# Patient Record
Sex: Female | Born: 2000 | Race: Black or African American | Hispanic: No | Marital: Single | Smoking: Never smoker
Health system: Southern US, Community
[De-identification: ages and names within clinical notes are randomized; demographics above are authoritative.]

## PROBLEM LIST (undated history)

## (undated) ENCOUNTER — Emergency Department (HOSPITAL_COMMUNITY): Discharge status: Absent without leave

## (undated) DIAGNOSIS — J45909 Unspecified asthma, uncomplicated: Secondary | ICD-10-CM

## (undated) DIAGNOSIS — L309 Dermatitis, unspecified: Secondary | ICD-10-CM

## (undated) DIAGNOSIS — Z9109 Other allergy status, other than to drugs and biological substances: Secondary | ICD-10-CM

---
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
## 2022-09-08 ENCOUNTER — Other Ambulatory Visit: Payer: Self-pay | Admitting: Internal Medicine

## 2022-09-09 LAB — COMPLETE METABOLIC PANEL WITH GFR
AG Ratio: 1.7 (calc) (ref 1.0–2.5)
ALT: 8 U/L (ref 5–32)
AST: 11 U/L — ABNORMAL LOW (ref 12–32)
Albumin: 4.3 g/dL (ref 3.6–5.1)
Alkaline phosphatase (APISO): 44 U/L (ref 36–128)
BUN: 14 mg/dL (ref 7–20)
CO2: 23 mmol/L (ref 20–32)
Calcium: 9.1 mg/dL (ref 8.9–10.4)
Chloride: 107 mmol/L (ref 98–110)
Creat: 0.65 mg/dL (ref 0.50–0.96)
Globulin: 2.5 g/dL (calc) (ref 2.0–3.8)
Glucose, Bld: 83 mg/dL (ref 65–99)
Potassium: 4.1 mmol/L (ref 3.8–5.1)
Sodium: 139 mmol/L (ref 135–146)
Total Bilirubin: 0.5 mg/dL (ref 0.2–1.1)
Total Protein: 6.8 g/dL (ref 6.3–8.2)
eGFR: 130 mL/min/{1.73_m2} (ref >=60)

## 2022-09-09 LAB — CBC
HCT: 38.3 % (ref 35.0–45.0)
Hemoglobin: 12.5 g/dL (ref 11.7–15.5)
MCH: 30.8 pg (ref 27.0–33.0)
MCHC: 32.6 g/dL (ref 32.0–36.0)
MCV: 94.3 fL (ref 80.0–100.0)
MPV: 9.6 fL (ref 7.5–12.5)
Platelets: 263 10*3/uL (ref 140–400)
RBC: 4.06 10*6/uL (ref 3.80–5.10)
RDW: 11.6 % (ref 11.0–15.0)
WBC: 4 10*3/uL (ref 3.8–10.8)

## 2022-09-09 LAB — LIPID PANEL
Cholesterol: 135 mg/dL (ref <170)
HDL: 54 mg/dL (ref >45)
LDL Cholesterol (Calc): 68 mg/dL (calc) (ref <110)
Non-HDL Cholesterol (Calc): 81 mg/dL (calc) (ref <120)
Total CHOL/HDL Ratio: 2.5 (calc) (ref <5.0)
Triglycerides: 46 mg/dL (ref <90)

## 2022-09-09 LAB — VITAMIN D 25 HYDROXY (VIT D DEFICIENCY, FRACTURES): Vit D, 25-Hydroxy: 14 ng/mL — ABNORMAL LOW (ref 30–100)

## 2023-02-07 ENCOUNTER — Emergency Department (HOSPITAL_COMMUNITY)

## 2023-02-07 ENCOUNTER — Encounter (HOSPITAL_COMMUNITY): Payer: Self-pay

## 2023-02-07 ENCOUNTER — Ambulatory Visit (HOSPITAL_COMMUNITY): Admission: EM | Admit: 2023-02-07 | Discharge: 2023-02-07 | Disposition: A | Discharge status: Home / Self Care

## 2023-02-07 ENCOUNTER — Encounter (HOSPITAL_COMMUNITY): Payer: Self-pay | Admitting: *Deleted

## 2023-02-07 ENCOUNTER — Emergency Department (HOSPITAL_COMMUNITY)
Admission: EM | Admit: 2023-02-07 | Discharge: 2023-02-07 | Disposition: A | Discharge status: Home / Self Care | Attending: Emergency Medicine | Admitting: Emergency Medicine

## 2023-02-07 ENCOUNTER — Other Ambulatory Visit: Payer: Self-pay

## 2023-02-07 DIAGNOSIS — S060X0A Concussion without loss of consciousness, initial encounter: Secondary | ICD-10-CM | POA: Diagnosis not present

## 2023-02-07 DIAGNOSIS — Y9321 Activity, ice skating: Secondary | ICD-10-CM | POA: Insufficient documentation

## 2023-02-07 DIAGNOSIS — S0990XA Unspecified injury of head, initial encounter: Secondary | ICD-10-CM | POA: Diagnosis present

## 2023-02-07 DIAGNOSIS — R519 Headache, unspecified: Secondary | ICD-10-CM

## 2023-02-07 MED ORDER — OXYCODONE-ACETAMINOPHEN 5-325 MG PO TABS: 1.0000 | ORAL_TABLET | Freq: Four times a day (QID) | ORAL | 0 refills | Status: AC | PRN

## 2023-02-07 MED ORDER — OXYCODONE-ACETAMINOPHEN 5-325 MG PO TABS
1.0000 | ORAL_TABLET | Freq: Once | ORAL | Status: AC
  Administered 2023-02-07: 1 via ORAL
  Filled 2023-02-07: qty 1

## 2023-02-07 NOTE — Discharge Instructions (Signed)
You were seen in the emergency department today for a head injury.  Your CT scan was negative for any acute findings such as a brain bleed or fractures in the skull.  I suspect that you have suffered a concussion from your multiple head injuries while skiing yesterday.  I provided you with a handout with some information how to manage this at home.  A prescription for Percocet was sent to her pharmacy for severe pain but you should take Tylenol or ibuprofen as needed for mild pain.  If you feel the symptoms are worsening or new symptoms arise, return the emergency department.

## 2023-02-07 NOTE — ED Provider Notes (Signed)
[place] [facility] AT [facility] Provider Note   CSN: [id number] Arrival date & time: 02/07/23  1421     History Chief Complaint  Patient presents with   Headache    Kathy Collins is a 22 y.o. female.  Patient without significant past medical history presents to the emergency department concerns of a head injury.  Reports that she was skiing yesterday and had multiple falls within without a helmet in place.  Endorsing mild headache and significant scalp tenderness on the posterior aspect of her head.  Denies any vomiting but has had some nausea.  Denies any vision changes or mood changes.  Not currently on any blood thinners.   Headache      Home Medications Prior to Admission medications   Medication Sig Start Date End Date Taking? Authorizing Provider  Prenatal Vit-Fe Fumarate-FA (PRENATAL COMPLETE) 14-0.4 MG TABS Take 1 tablet by mouth daily. 01/03/21   Raspet, Noberto Retort, PA-C      Allergies    Patient has no known allergies.    Review of Systems   Review of Systems  Neurological:  Positive for headaches.  All other systems reviewed and are negative.   Physical Exam Updated Vital Signs BP 109/63   Pulse 68   Temp 98.2 F (36.8 C) (Oral)   Resp 12   Ht 5\' 5"  (1.651 m)   Wt 63.5 kg   LMP 12/18/2022 (Approximate)   SpO2 100%   BMI 23.30 kg/m  Physical Exam Vitals and nursing note reviewed.  Constitutional:      General: She is not in acute distress.    Appearance: She is well-developed.  HENT:     Head: Normocephalic and atraumatic.  Eyes:     Conjunctiva/sclera: Conjunctivae normal.  Cardiovascular:     Rate and Rhythm: Normal rate and regular rhythm.     Heart sounds: No murmur heard. Pulmonary:     Effort: Pulmonary effort is normal. No respiratory distress.     Breath sounds: Normal breath sounds.  Abdominal:     Palpations: Abdomen is soft.     Tenderness: There is no abdominal tenderness.  Musculoskeletal:         General: No swelling.     Cervical back: Neck supple.  Skin:    General: Skin is warm and dry.     Capillary Refill: Capillary refill takes less than 2 seconds.  Neurological:     Mental Status: She is alert.     Cranial Nerves: No cranial nerve deficit or facial asymmetry.  Psychiatric:        Mood and Affect: Mood normal. Mood is not anxious.        Speech: Speech normal.        Behavior: Behavior is not agitated.     ED Results / Procedures / Treatments   Labs (all labs ordered are listed, but only abnormal results are displayed) Labs Reviewed - No data to display  EKG None  Radiology No results found.  Procedures Procedures   Medications Ordered in ED Medications  oxyCODONE-acetaminophen (PERCOCET/ROXICET) 5-325 MG per tablet 1 tablet (has no administration in time range)    ED Course/ Medical Decision Making/ A&P                               Medical Decision Making Amount and/or Complexity of Data Reviewed Radiology: ordered.  Risk Prescription drug management.  This patient presents to the ED for concern of headache.  Differential diagnosis includes concussion, SAH, head injury   Imaging Studies ordered:  I ordered imaging studies including CT head I independently visualized and interpreted imaging which showed *** I agree with the radiologist interpretation   Medicines ordered and prescription drug management:  I ordered medication including Percocet for pain Reevaluation of the patient after these medicines showed that the patient {resolved/improved/worsened:23923::"improved"} I have reviewed the patients home medicines and have made adjustments as needed   Problem List / ED Course:  Patient presents to the emergency department concerns of a head injury.  She reports that she was skating yesterday and had multiple falls with head strike.  Was not using a helmet at this time.  Not currently any blood thinners.   Social Determinants  of Health:    Final Clinical Impression(s) / ED Diagnoses Final diagnoses:  None    Rx / DC Orders ED Discharge Orders     None

## 2023-02-07 NOTE — ED Triage Notes (Signed)
Pt was skiing yesterday and took multiple falls, pt c.o generalized pain all over but states her head is very tender to where she can't even touch it. Pt denies any LOC. Pt sent here from [place] for further eval.

## 2023-02-07 NOTE — ED Provider Notes (Signed)
Patient presents to clinic for head pain and pressure that started today.  She went skiing for the first time yesterday and fell and hit her head well over 10 times.  There was 1 time where she hit her head after taking a bad tumble and she felt different afterwards.  She did not lose consciousness and has not had any vomiting.  She does feel nauseous today.  Has a lot of pressure in her head and pain to palpation of her skull.   Due to recurrent head trauma and feeling differently after the last head trauma, advised that she head to [facility] for head CT.  Patient will head to ED via POV.     Aslan Montagna, [place], [place] 02/07/23 1416

## 2023-02-07 NOTE — ED Triage Notes (Signed)
Pt reports falling yesterday while ski and hit head multiple times. Pt now has a head ache in loud invorments ,Pt reports pressure in head and Head is tender to touch. Pt never passed out. Pt also reports nausea with out vomiting.
